# Patient Record
Sex: Male | Born: 1965 | Smoking: Current every day smoker
Health system: Southern US, Community
[De-identification: ages and names within clinical notes are randomized; demographics above are authoritative.]

## PROBLEM LIST (undated history)

## (undated) DIAGNOSIS — Z87820 Personal history of traumatic brain injury: Secondary | ICD-10-CM

## (undated) DIAGNOSIS — I2699 Other pulmonary embolism without acute cor pulmonale: Secondary | ICD-10-CM

---

## 2015-09-28 ENCOUNTER — Emergency Department (HOSPITAL_COMMUNITY): Payer: Self-pay

## 2015-09-28 ENCOUNTER — Encounter (HOSPITAL_COMMUNITY): Payer: Self-pay | Admitting: Emergency Medicine

## 2015-09-28 ENCOUNTER — Emergency Department (HOSPITAL_COMMUNITY)
Admission: EM | Admit: 2015-09-28 | Discharge: 2015-09-28 | Disposition: A | Discharge status: Home / Self Care | Payer: Self-pay | Attending: Emergency Medicine | Admitting: Emergency Medicine

## 2015-09-28 DIAGNOSIS — R0781 Pleurodynia: Secondary | ICD-10-CM

## 2015-09-28 DIAGNOSIS — F17210 Nicotine dependence, cigarettes, uncomplicated: Secondary | ICD-10-CM | POA: Insufficient documentation

## 2015-09-28 DIAGNOSIS — R0789 Other chest pain: Secondary | ICD-10-CM | POA: Insufficient documentation

## 2015-09-28 DIAGNOSIS — F1410 Cocaine abuse, uncomplicated: Secondary | ICD-10-CM | POA: Insufficient documentation

## 2015-09-28 DIAGNOSIS — F1010 Alcohol abuse, uncomplicated: Secondary | ICD-10-CM | POA: Insufficient documentation

## 2015-09-28 DIAGNOSIS — F1910 Other psychoactive substance abuse, uncomplicated: Secondary | ICD-10-CM

## 2015-09-28 HISTORY — DX: Other pulmonary embolism without acute cor pulmonale: I26.99

## 2015-09-28 HISTORY — DX: Personal history of traumatic brain injury: Z87.820

## 2015-09-28 LAB — CBC
HEMATOCRIT: 38.8 % — AB (ref 39.0–52.0)
Hemoglobin: 12.6 g/dL — ABNORMAL LOW (ref 13.0–17.0)
MCH: 26 pg (ref 26.0–34.0)
MCHC: 32.5 g/dL (ref 30.0–36.0)
MCV: 80.2 fL (ref 78.0–100.0)
PLATELETS: 215 10*3/uL (ref 150–400)
RBC: 4.84 MIL/uL (ref 4.22–5.81)
RDW: 15.3 % (ref 11.5–15.5)
WBC: 5.6 10*3/uL (ref 4.0–10.5)

## 2015-09-28 LAB — I-STAT TROPONIN, ED
TROPONIN I, POC: 0 ng/mL (ref 0.00–0.08)
Troponin i, poc: 0 ng/mL (ref 0.00–0.08)

## 2015-09-28 LAB — BASIC METABOLIC PANEL
Anion gap: 8 (ref 5–15)
BUN: 11 mg/dL (ref 6–20)
CHLORIDE: 107 mmol/L (ref 101–111)
CO2: 23 mmol/L (ref 22–32)
CREATININE: 0.99 mg/dL (ref 0.61–1.24)
Calcium: 9.1 mg/dL (ref 8.9–10.3)
Glucose, Bld: 92 mg/dL (ref 65–99)
POTASSIUM: 3.7 mmol/L (ref 3.5–5.1)
SODIUM: 138 mmol/L (ref 135–145)

## 2015-09-28 MED ORDER — IOPAMIDOL (ISOVUE-370) INJECTION 76%
INTRAVENOUS | Status: AC
  Administered 2015-09-28: 100 mL
  Filled 2015-09-28: qty 100

## 2015-09-28 MED ORDER — LORAZEPAM 2 MG/ML IJ SOLN
1.0000 mg | Freq: Once | INTRAMUSCULAR | Status: AC
  Administered 2015-09-28: 1 mg via INTRAVENOUS
  Filled 2015-09-28: qty 1

## 2015-09-28 MED ORDER — MORPHINE SULFATE (PF) 4 MG/ML IV SOLN
4.0000 mg | Freq: Once | INTRAVENOUS | Status: AC
  Administered 2015-09-28: 4 mg via INTRAVENOUS
  Filled 2015-09-28: qty 1

## 2015-09-28 NOTE — ED Triage Notes (Signed)
Pt complaining of CP , SOB. Pt having SOB/CP for x1 week. Pt states the last time he felt like this was when he had a PE 5 years ago. Pt admits to smoking crack/cocaine daily- today. BP 128/78, HR 72, resp 18 100% on room air.

## 2015-09-28 NOTE — ED Notes (Signed)
PA made aware of pt's bradycardia- HR 40's. Repeat EKG.

## 2015-09-28 NOTE — ED Notes (Addendum)
Pt's sat while ambulating 96-98% on room air. Pt ambulated well without assistance. Pt did complain of some dizziness while ambulating.

## 2015-09-28 NOTE — ED Notes (Signed)
Pt returned from Xray. Placed back on monitor. 

## 2015-09-28 NOTE — ED Provider Notes (Signed)
[facility]-EMERGENCY DEPT Provider Note   CSN: [id number] Arrival date & time: 09/28/15  0757     History   Chief Complaint Chief Complaint  Patient presents with  . Chest Pain  . Shortness of Breath    HPI Jared Robertson is a 50 y.o. male.  HPI   50 year old male with prior history of DVT presenting to ED with complaints of pleuritic chest pain and shortness of breath. Patient states for the past week he has had progressive worsening shortness of breath with laying flat and with exertion as well as having pleuritic chest pain. Pain worsening when he takes a deep breath. He also endorsed cough productive with increased sputum with "specks of black stuff" in it. Today he also endorsed lightheadedness dizziness with his symptoms. No specific treatment tried. He also admits to using crack cocaine on a daily basis, last use was today. Admits to drinking beer regularly, and states he has 2 40oz beers since last night.  patient works in [employer]. He also report having DVT in his left leg 3 years ago, was on Lovenox for 1 year. Report constant swelling in his left leg since the DVT and has not changed. No identifying triggers for his DVT. He is not currently on any anticoagulant. He is a smoker. He denies any prior history of MI. He denies any associated fever, nausea, diaphoresis, arm pain or rash.   Past Medical History:  Diagnosis Date  . H/O multiple concussions   . PE (pulmonary embolism)     There are no active problems to display for this patient.   History reviewed. No pertinent surgical history.     Home Medications    Prior to Admission medications   Not on File    Family History History reviewed. No pertinent family history.  Social History Social History  Substance Use Topics  . Smoking status: Current Every Day Smoker    Packs/day: 0.50    Types: Cigarettes  . Smokeless tobacco: Never Used  . Alcohol use Yes     Comment: "2, 40's of beer"     Allergies     Review of patient's allergies indicates no known allergies.   Review of Systems Review of Systems  All other systems reviewed and are negative.    Physical Exam Updated Vital Signs BP 123/82 (BP Location: Right Arm)   Resp 21   SpO2 100%   Physical Exam  Constitutional: He appears well-developed and well-nourished. No distress.  Patient appears drowsy but arousable  HENT:  Head: Atraumatic.  Eyes: Conjunctivae are normal.  Neck: Normal range of motion. Neck supple.  Cardiovascular:  Bradycardia without murmurs or gallops  Pulmonary/Chest: Effort normal and breath sounds normal. He exhibits no tenderness.  Abdominal: Soft. There is no tenderness.  Musculoskeletal: He exhibits edema (2+ pitting edema to left lower extremitity, intact distal pulses no palpable cords).  Neurological: He is alert.  Skin: Capillary refill takes less than 2 seconds. No rash noted.  Psychiatric: He has a normal mood and affect.  Nursing note and vitals reviewed.    ED Treatments / Results  Labs (all labs ordered are listed, but only abnormal results are displayed) Labs Reviewed  CBC - Abnormal; Notable for the following:       Result Value   Hemoglobin 12.6 (*)    HCT 38.8 (*)    All other components within normal limits  BASIC METABOLIC PANEL  I-STAT TROPOININ, ED  I-STAT TROPOININ, ED    EKG  EKG Interpretation  Date/Time:  Saturday September 28 2015 08:06:21 EDT Ventricular Rate:  60 PR Interval:    QRS Duration: 88 QT Interval:  414 QTC Calculation: 414 R Axis:   8 Text Interpretation:  Sinus rhythm Short PR interval Probable anteroseptal infarct, old ST elevation, consider inferior injury Confirmed by Freida Busman  MD, ANTHONY ([id number]) on 09/28/2015 8:24:37 AM       Radiology Dg Chest 2 View  Result Date: 09/28/2015 CLINICAL DATA:  Chest pressure for 1 week. EXAM: CHEST  2 VIEW COMPARISON:  None. FINDINGS: The heart size and mediastinal contours are within normal limits. There is  no focal infiltrate, pulmonary edema, or pleural effusion. Degenerative joint changes of the spine are noted. IMPRESSION: No active cardiopulmonary disease. Electronically Signed   By: Sherian Rein M.D.   On: 09/28/2015 08:48    Procedures Procedures (including critical care time)  Medications Ordered in ED Medications  morphine 4 MG/ML injection 4 mg (4 mg Intravenous Given 09/28/15 0900)  iopamidol (ISOVUE-370) 76 % injection (100 mLs  Contrast Given 09/28/15 0946)  LORazepam (ATIVAN) injection 1 mg (1 mg Intravenous Given 09/28/15 1058)     Initial Impression / Assessment and Plan / ED Course  I have reviewed the triage vital signs and the nursing notes.  Pertinent labs & imaging results that were available during my care of the patient were reviewed by me and considered in my medical decision making (see chart for details).  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    Final Clinical Impressions(s) / ED Diagnoses   Final diagnoses:  Chest pain, pleuritic  Polysubstance abuse    New Prescriptions New Prescriptions   No medications on file   10:01 AM Patient here with pleuritic chest pain and shortness of breath. Prior history of DVT before he has increased risk of PE. He also abuses crack cocaine which also increased risk of ACS. Workup initiated, pain medication given, will monitor closely.  Care discussed with Dr. Freida Busman.   1:06 PM Negative delta troponin. Chest CT angiogram without evidence of PE or pneumonia. Labs otherwise reassuring. EKG was seen by Dr. Freida Busman, does not think he has any significant sign to suggest acute ischemic changes. Patient is resting comfortably. Suspect his symptoms is likely secondary to crack and cocaine use. Recommend cessation of drug use. Patient will be discharge with outpatient resource. Return  precaution discussed.   Fayrene Helper, PA-C 09/28/15 1313    Lorre Nick, MD 09/30/15 [phone]

## 2015-09-28 NOTE — Discharge Instructions (Signed)
Please avoid using drugs as it can worsen your chest pain.  Use resources provided to find help.

## 2017-12-23 IMAGING — CT CT ANGIO CHEST
2 of 6 series · 18 of 36 positions shown · IV contrast (Omni 300)
Comparison: Chest x-ray earlier today.

CLINICAL DATA: Anterior chest pain radiating into back with
associated shortness of breath for the last week.

EXAM:
CT ANGIOGRAPHY CHEST WITH CONTRAST
TECHNIQUE: Multidetector CT imaging of the chest was performed using the
standard protocol during bolus administration of intravenous
contrast. Multiplanar CT image reconstructions and MIPs were
obtained to evaluate the vascular anatomy.
CONTRAST:  100 mL Isovue 370 IV

[Series 6: pe thins · axial · 0.61mm/px · z∈[+1094,+1373]mm · 17 of 315 slices shown]
[im 18/315  lung]
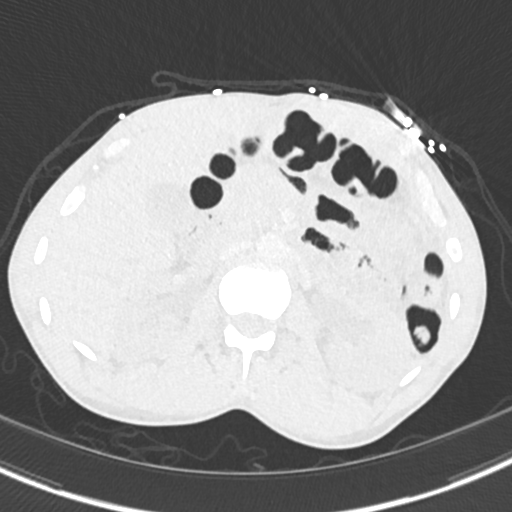
[im 35/315  mediastinal]
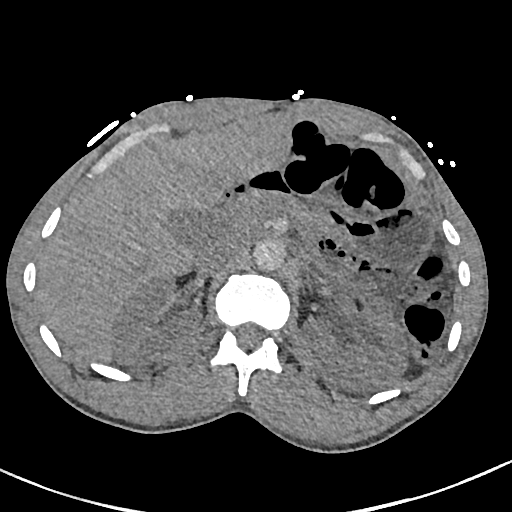
[im 53/315  lung]
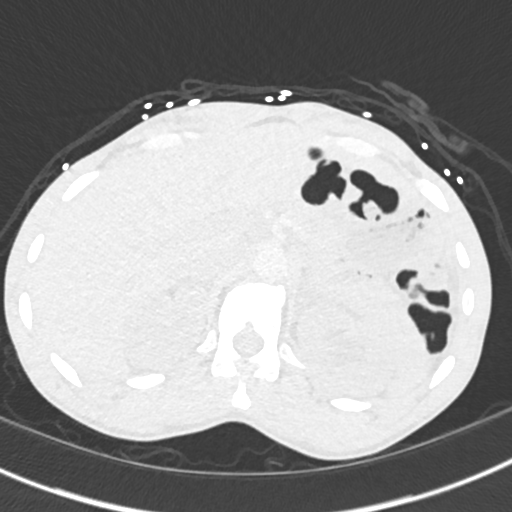
[im 70/315  mediastinal]
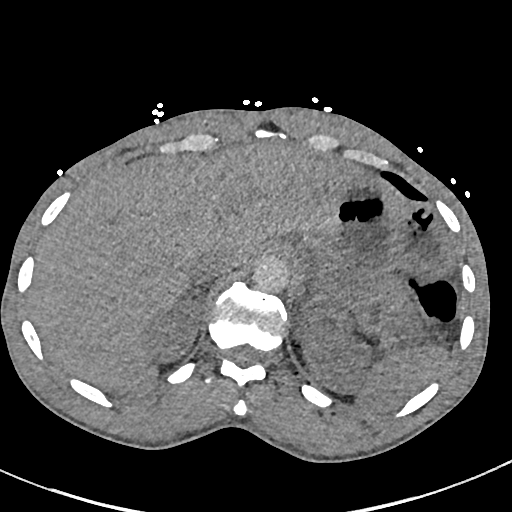
[im 88/315  lung]
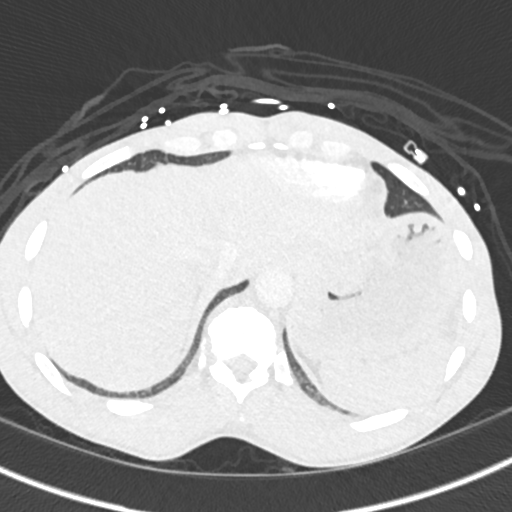
[im 105/315  mediastinal]
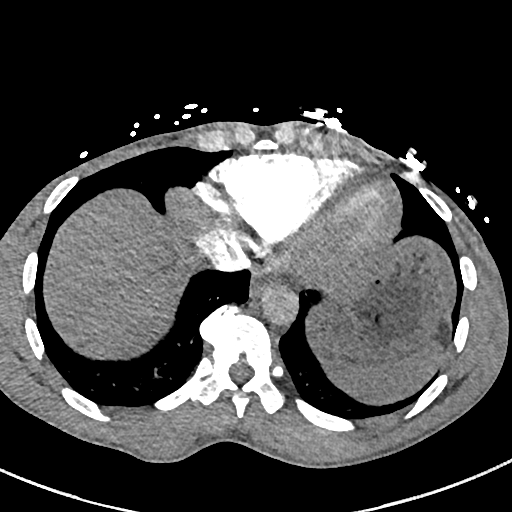
[im 123/315  lung]
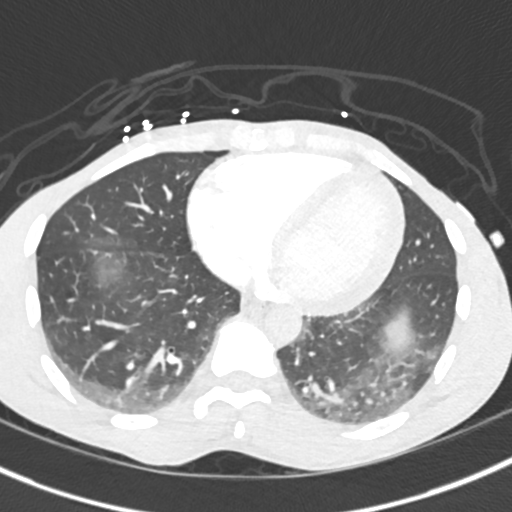
[im 140/315  mediastinal]
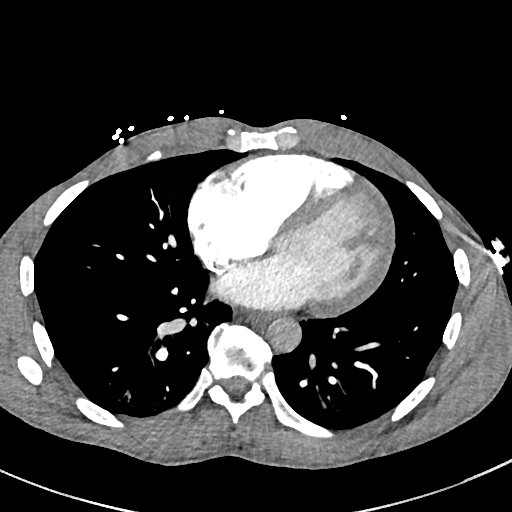
[im 158/315  lung]
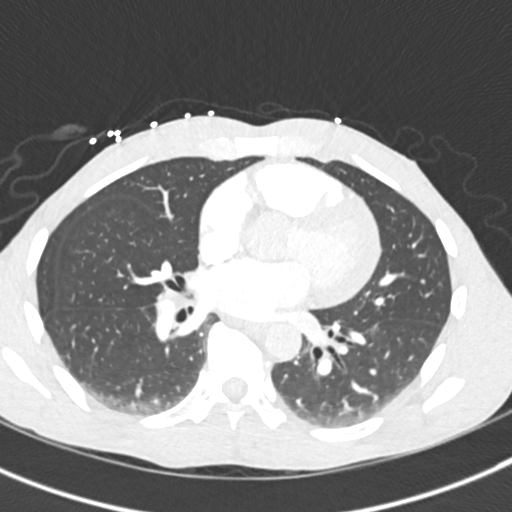
[im 175/315  mediastinal]
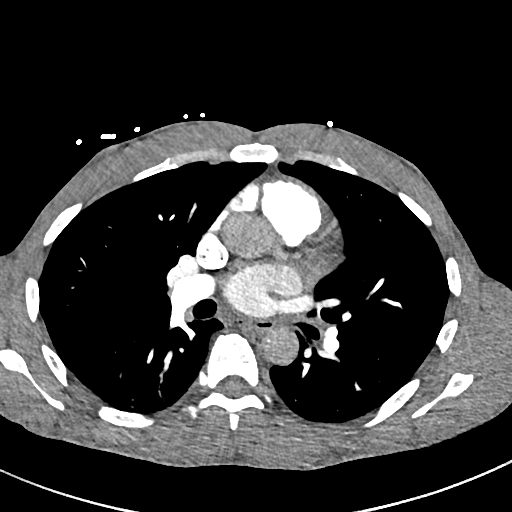
[im 192/315  lung]
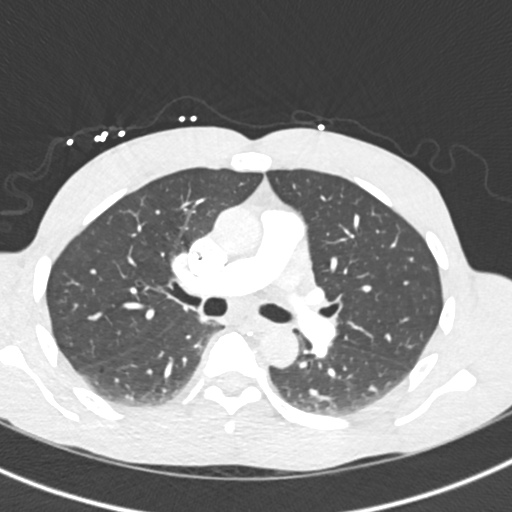
[im 210/315  mediastinal]
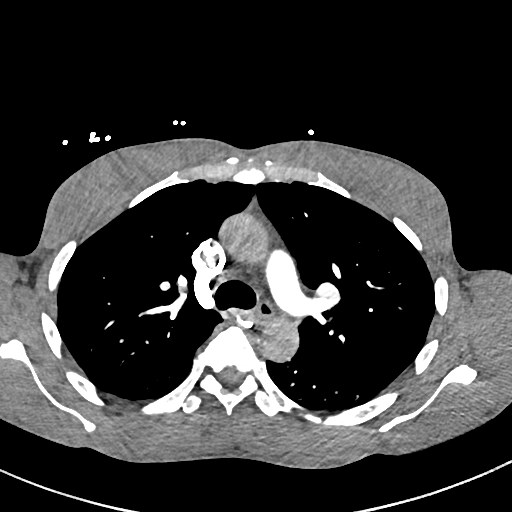
[im 227/315  lung]
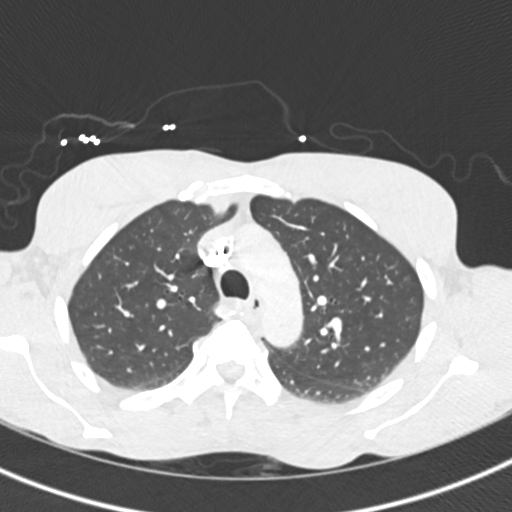
[im 245/315  mediastinal]
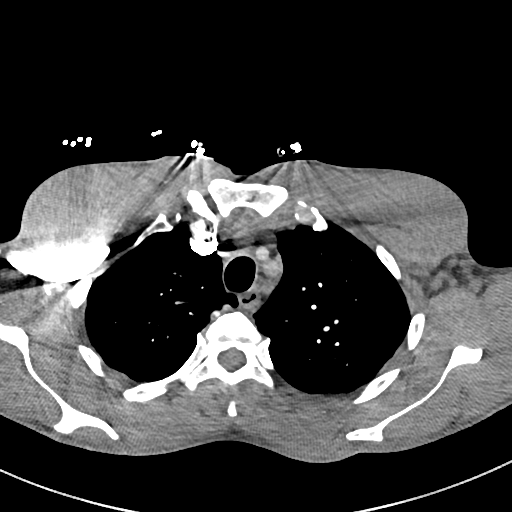
[im 262/315  lung]
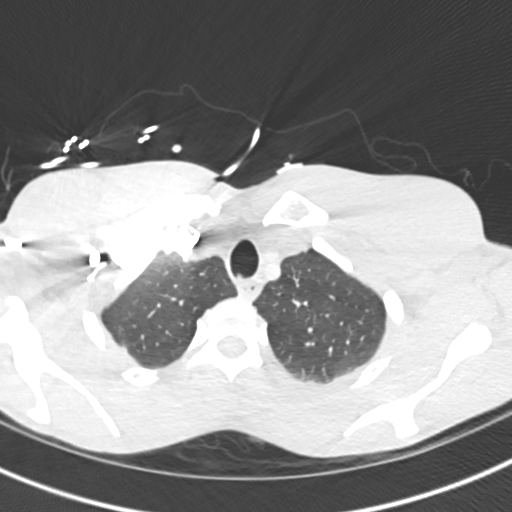
[im 280/315  mediastinal]
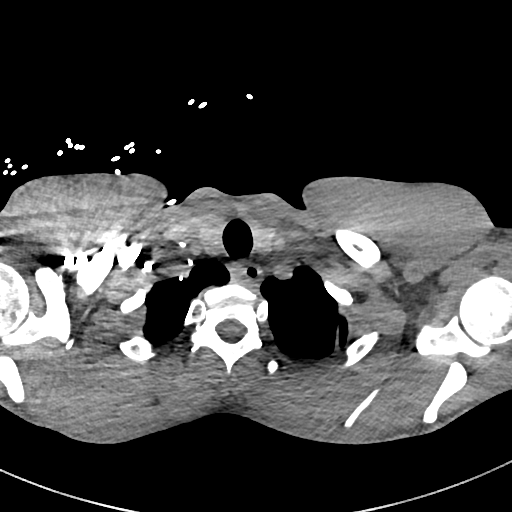
[im 297/315  lung]
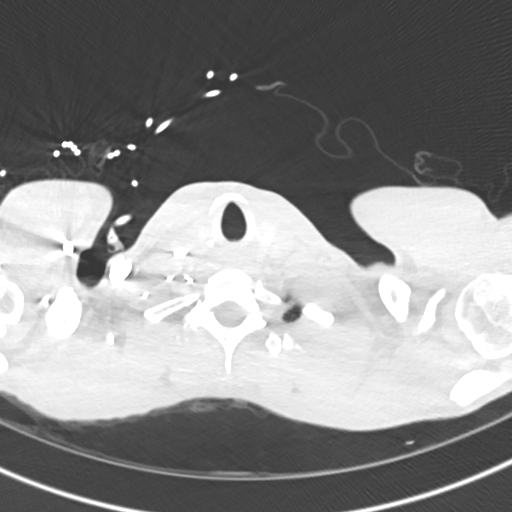

[Series 7: pe 2mm cor · coronal · 0.62mm/px · 1 of 98 slices shown]
[im 49/98  mediastinal]
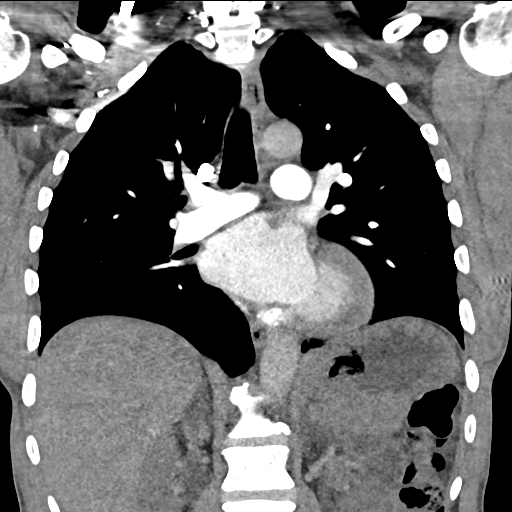

[18 of 36 positions shown; findings below may reference images not displayed]

FINDINGS: Cardiovascular: The pulmonary arteries are very well opacified.
There is no evidence of pulmonary embolism by CT. The thoracic aorta
is normal in caliber. The heart size is normal. No pericardial fluid
identified. No visualize coronary artery calcifications.

Mediastinum/Nodes: No evidence of mediastinal masses, mediastinal
lymphadenopathy or hilar lymphadenopathy.

Lungs/Pleura: Minimal bibasilar atelectasis. There is no evidence of
pulmonary edema, consolidation, pneumothorax, nodule or pleural
fluid.

Upper Abdomen: Unremarkable visualized upper abdominal structures.

Musculoskeletal: Mild spondylosis of the thoracic spine without
evidence of fracture or bony lesions.

Review of the MIP images confirms the above findings.
IMPRESSION: Unremarkable CTA of the chest demonstrating no evidence of pulmonary
embolism or other acute findings.
